# Patient Record
Sex: Male | Born: 2000 | Race: Black or African American | Hispanic: No | Marital: Single | Smoking: Never smoker
Health system: Southern US, Community
[De-identification: ages and names within clinical notes are randomized; demographics above are authoritative.]

---
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
## 2018-10-15 IMAGING — CR DG ANKLE COMPLETE 3+V*R*
3 series · 3 of 3 positions shown · non-contrast
Comparison: None.

CLINICAL DATA: Right ankle pain, football injury

EXAM:
RIGHT ANKLE - COMPLETE 3+ VIEW

[t ankle joint ap right]
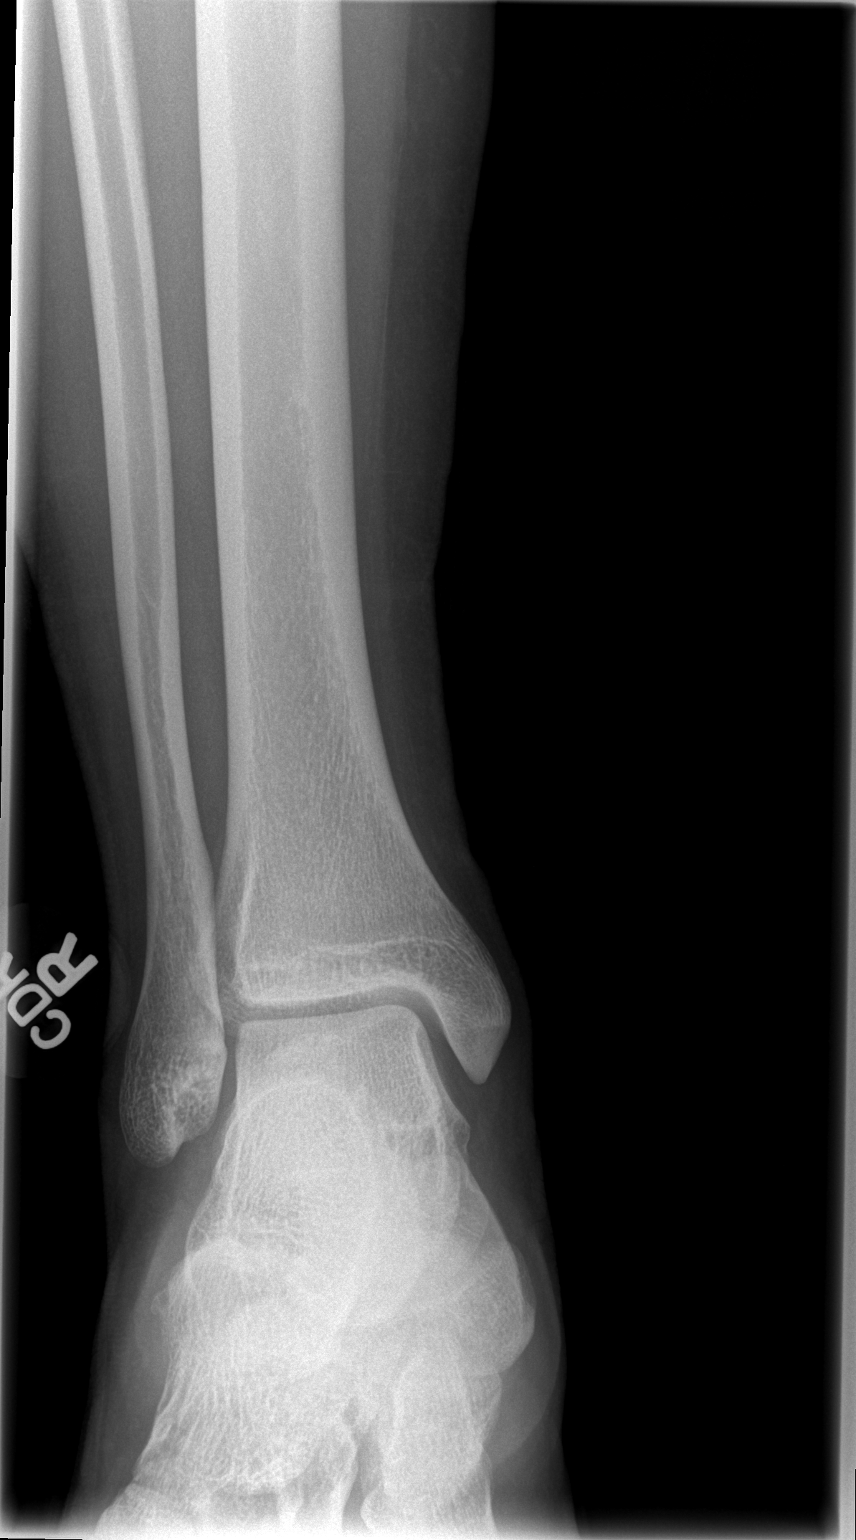

[t ankle joint oblique right]
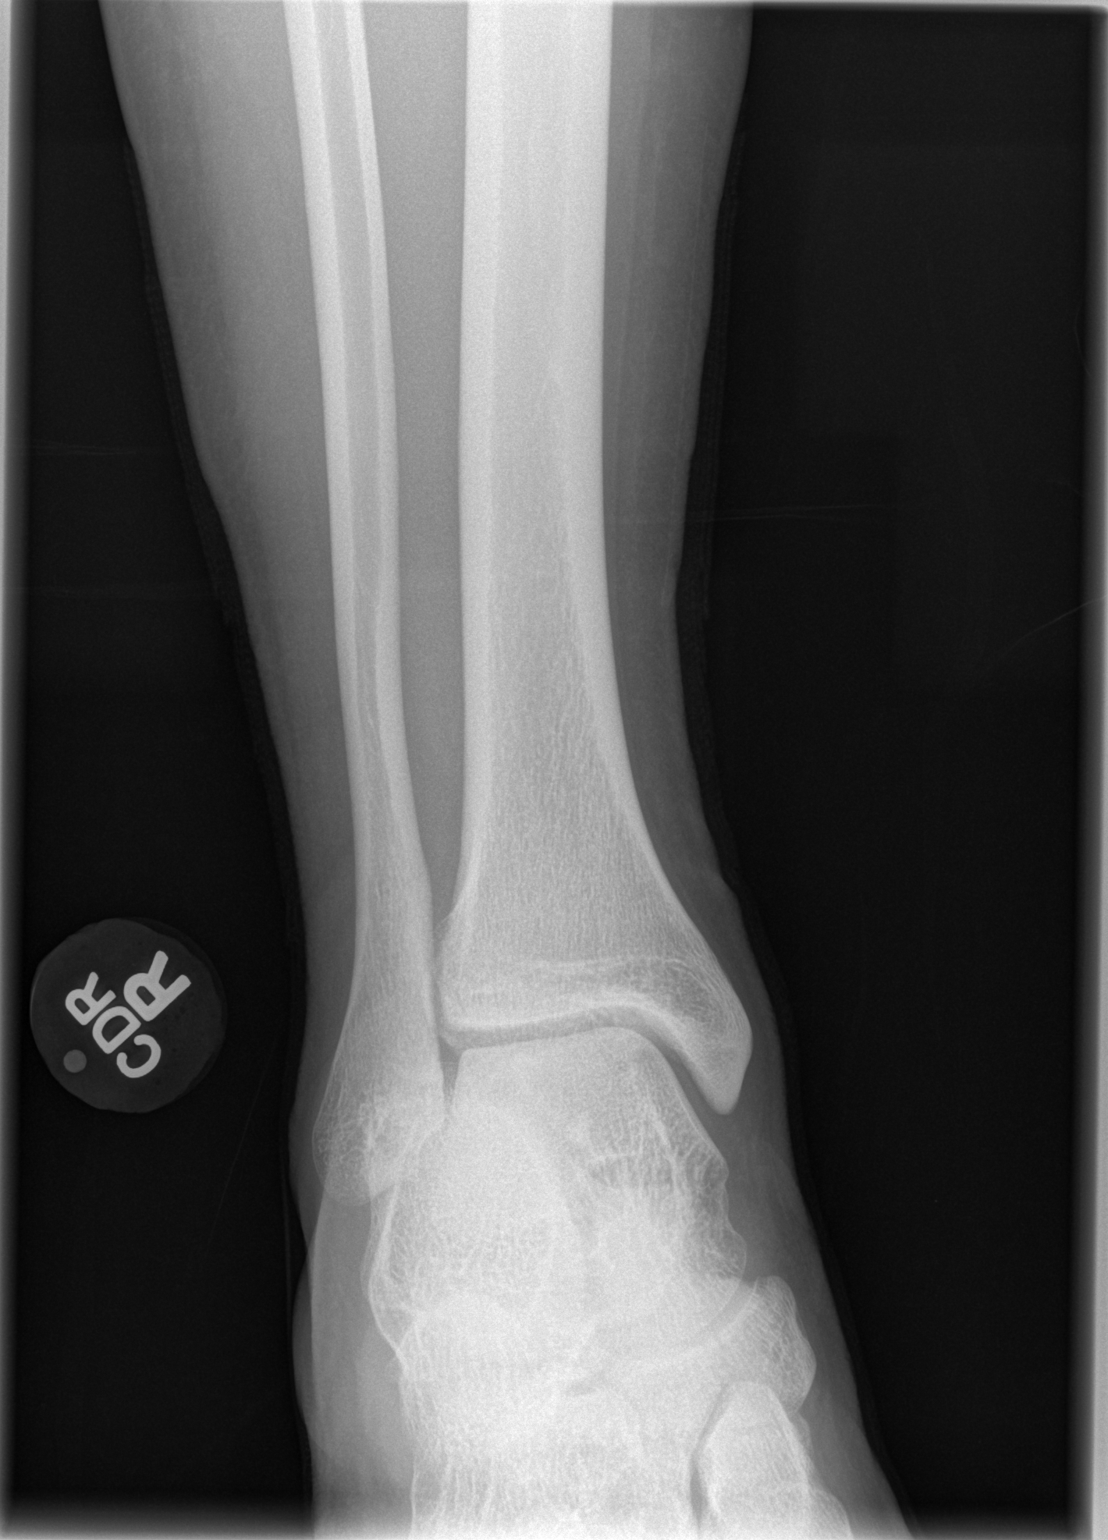

[t ankle joint lat right]
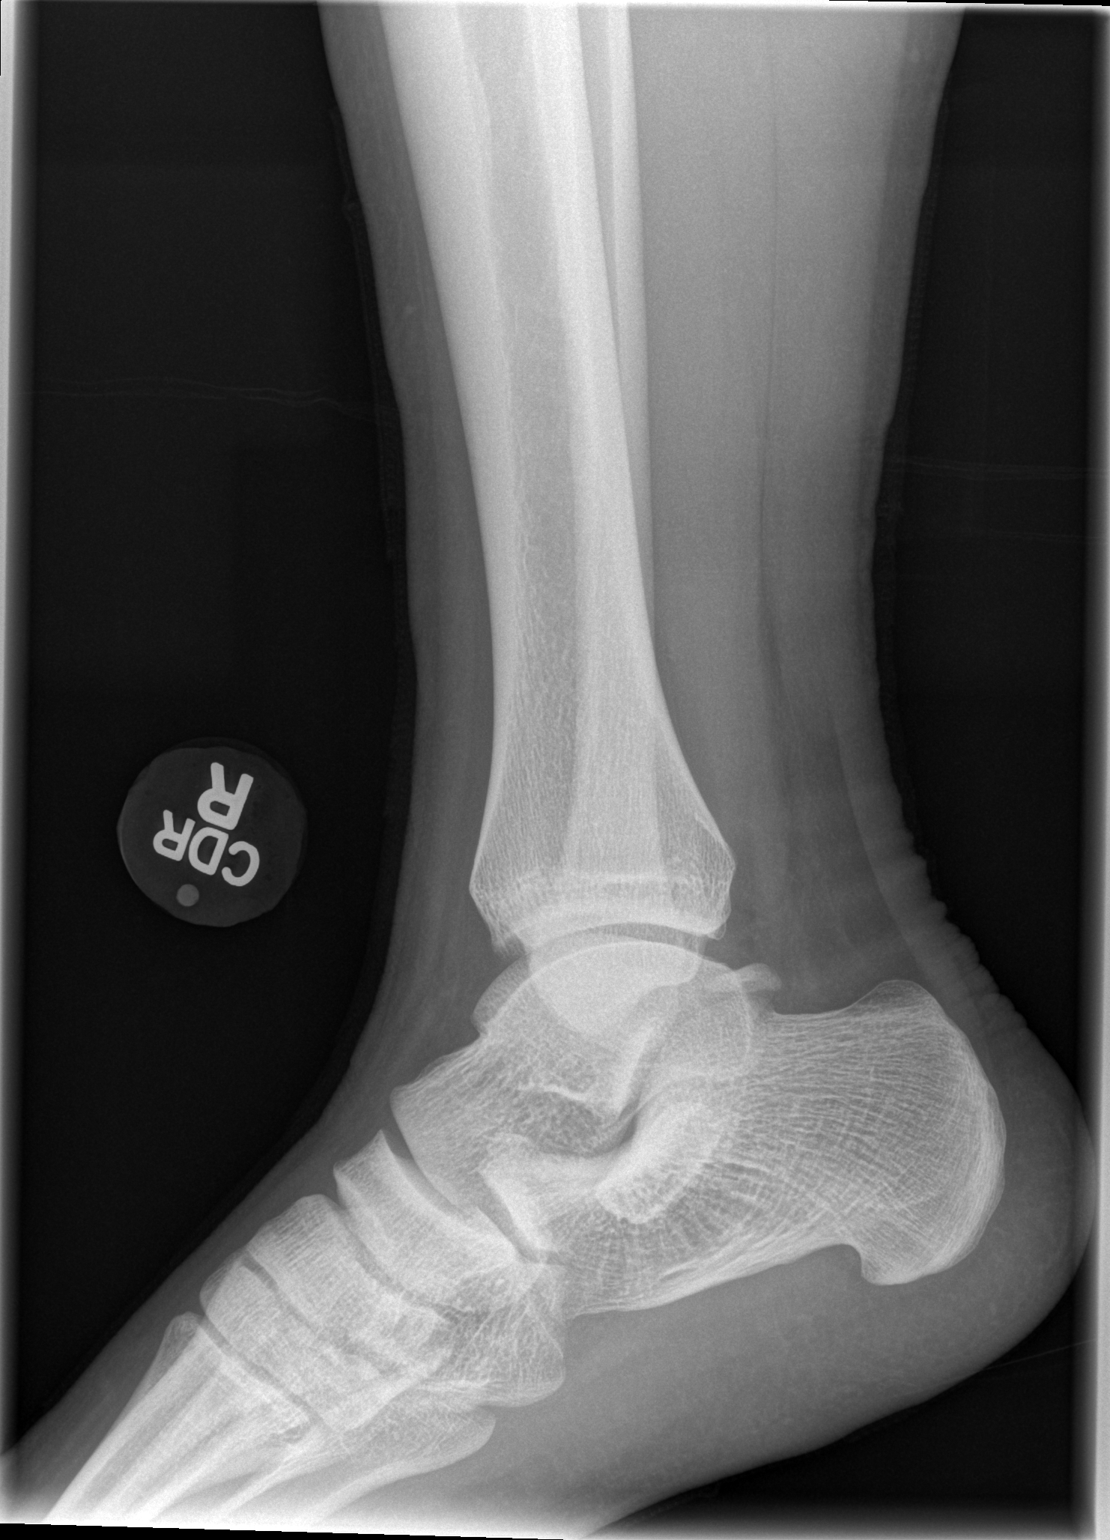

[3 of 3 positions shown; findings below may reference images not displayed]

FINDINGS: No fracture or dislocation is seen.

The ankle mortise is intact.

The base of the fifth metatarsal is unremarkable.

The visualized soft tissues are unremarkable.
IMPRESSION: Negative.

## 2020-12-03 ENCOUNTER — Emergency Department (HOSPITAL_BASED_OUTPATIENT_CLINIC_OR_DEPARTMENT_OTHER)
Admission: EM | Admit: 2020-12-03 | Discharge: 2020-12-04 | Disposition: A | Discharge status: Home / Self Care | Payer: Medicaid Other | Attending: Emergency Medicine | Admitting: Emergency Medicine

## 2020-12-03 ENCOUNTER — Encounter (HOSPITAL_BASED_OUTPATIENT_CLINIC_OR_DEPARTMENT_OTHER): Payer: Self-pay | Admitting: *Deleted

## 2020-12-03 ENCOUNTER — Other Ambulatory Visit: Payer: Self-pay

## 2020-12-03 DIAGNOSIS — F1290 Cannabis use, unspecified, uncomplicated: Secondary | ICD-10-CM | POA: Insufficient documentation

## 2020-12-03 DIAGNOSIS — F19922 Other psychoactive substance use, unspecified with intoxication with perceptual disturbance: Secondary | ICD-10-CM

## 2020-12-03 DIAGNOSIS — F19122 Other psychoactive substance abuse with intoxication with perceptual disturbances: Secondary | ICD-10-CM | POA: Diagnosis not present

## 2020-12-03 DIAGNOSIS — Z7722 Contact with and (suspected) exposure to environmental tobacco smoke (acute) (chronic): Secondary | ICD-10-CM | POA: Diagnosis not present

## 2020-12-03 NOTE — ED Triage Notes (Signed)
Pt reports taking 4-5 THC gummies  at 845pm, mother reports lethargic and weak

## 2020-12-04 LAB — COMPREHENSIVE METABOLIC PANEL WITH GFR
ALT: 33 U/L (ref 0–44)
AST: 29 U/L (ref 15–41)
Albumin: 4.3 g/dL (ref 3.5–5.0)
Alkaline Phosphatase: 63 U/L (ref 38–126)
Anion gap: 10 (ref 5–15)
BUN: 21 mg/dL — ABNORMAL HIGH (ref 6–20)
CO2: 26 mmol/L (ref 22–32)
Calcium: 9.2 mg/dL (ref 8.9–10.3)
Chloride: 103 mmol/L (ref 98–111)
Creatinine, Ser: 1.36 mg/dL — ABNORMAL HIGH (ref 0.61–1.24)
GFR, Estimated: >60 mL/min
Glucose, Bld: 120 mg/dL — ABNORMAL HIGH (ref 70–99)
Potassium: 4.1 mmol/L (ref 3.5–5.1)
Sodium: 139 mmol/L (ref 135–145)
Total Bilirubin: 0.2 mg/dL — ABNORMAL LOW (ref 0.3–1.2)
Total Protein: 7.8 g/dL (ref 6.5–8.1)

## 2020-12-04 LAB — CBC WITH DIFFERENTIAL/PLATELET
Abs Immature Granulocytes: 0.08 K/uL — ABNORMAL HIGH (ref 0.00–0.07)
Basophils Absolute: 0.1 K/uL (ref 0.0–0.1)
Basophils Relative: 0 %
Eosinophils Absolute: 0.1 K/uL (ref 0.0–0.5)
Eosinophils Relative: 1 %
HCT: 42.6 % (ref 39.0–52.0)
Hemoglobin: 13.9 g/dL (ref 13.0–17.0)
Immature Granulocytes: 1 %
Lymphocytes Relative: 27 %
Lymphs Abs: 3.1 K/uL (ref 0.7–4.0)
MCH: 26.3 pg (ref 26.0–34.0)
MCHC: 32.6 g/dL (ref 30.0–36.0)
MCV: 80.7 fL (ref 80.0–100.0)
Monocytes Absolute: 1.2 K/uL — ABNORMAL HIGH (ref 0.1–1.0)
Monocytes Relative: 10 %
Neutro Abs: 6.8 K/uL (ref 1.7–7.7)
Neutrophils Relative %: 61 %
Platelets: 370 K/uL (ref 150–400)
RBC: 5.28 MIL/uL (ref 4.22–5.81)
RDW: 13.2 % (ref 11.5–15.5)
WBC: 11.2 K/uL — ABNORMAL HIGH (ref 4.0–10.5)
nRBC: 0 % (ref 0.0–0.2)

## 2020-12-04 LAB — RAPID URINE DRUG SCREEN, HOSP PERFORMED
Amphetamines: NOT DETECTED
Barbiturates: NOT DETECTED
Benzodiazepines: NOT DETECTED
Cocaine: NOT DETECTED
Opiates: NOT DETECTED
Tetrahydrocannabinol: POSITIVE — AB

## 2020-12-04 LAB — ETHANOL: Alcohol, Ethyl (B): <10 mg/dL (ref <10)

## 2020-12-04 MED ORDER — SODIUM CHLORIDE 0.9 % IV BOLUS
1000.0000 mL | Freq: Once | INTRAVENOUS | Status: AC
  Administered 2020-12-04: 1000 mL via INTRAVENOUS

## 2020-12-04 NOTE — ED Provider Notes (Signed)
[facility] EMERGENCY DEPARTMENT Provider Note  CSN: [id number] Arrival date & time: 12/03/20 2305    History Chief Complaint  Patient presents with  . Ingestion    HPI  Steven Massey is a 20 y.o. male with no significant PMH brought to the ED by his mother who provides the history. Patient was in his usual state of health until just prior to arrival. Mother reports they were sitting on the front porch when he got up and collapsed onto her, unable to stand up and was not speaking clearly. Mother reports she put him in the car with his brother and they headed towards the ED. On the way here, she discovered that he had eaten several THC Edibles that he described as 'like the candy nerds". She states he was able to stand and walk into the ED. She reports he does occasionally drink EtOH but does not think he had any tonight. No known head injuries. He denied any other co-ingestion to her but is unable to give any further details at the time of my evaluation.    History reviewed. No pertinent past medical history.  History reviewed. No pertinent surgical history.  No family history on file.  Social History   Tobacco Use  . Smoking status: Passive Smoke Exposure - Never Smoker  . Smokeless tobacco: Never Used  Substance Use Topics  . Alcohol use: No  . Drug use: Yes    Types: Marijuana     Home Medications Prior to Admission medications   Medication Sig Start Date End Date Taking? Authorizing Provider  acetaminophen-codeine (TYLENOL #3) 300-30 MG per tablet Take 1-2 tablets by mouth every 6 (six) hours as needed for pain. 03/06/13   Muthersbaugh, Dahlia Client, PA-C  ibuprofen (ADVIL,MOTRIN) 800 MG tablet Take 1 tablet (800 mg total) by mouth every 8 (eight) hours as needed for pain. 03/06/13   Muthersbaugh, Dahlia Client, PA-C     Allergies    Patient has no known allergies.   Review of Systems   Review of Systems Unable to assess due to mental status.    Physical Exam BP  117/65 (BP Location: Right Arm)   Pulse 85   Temp 98.3 F (36.8 C) (Oral)   Resp 19   Ht 5\' 5"  (1.651 m)   Wt 109.8 kg   SpO2 100%   BMI 40.27 kg/m   Physical Exam Vitals and nursing note reviewed.  Constitutional:      Appearance: Normal appearance.     Comments: Somnolent  HENT:     Head: Normocephalic and atraumatic.     Nose: Nose normal.     Mouth/Throat:     Mouth: Mucous membranes are moist.  Eyes:     Extraocular Movements: Extraocular movements intact.     Conjunctiva/sclera: Conjunctivae normal.     Pupils: Pupils are equal, round, and reactive to light.  Cardiovascular:     Rate and Rhythm: Normal rate.  Pulmonary:     Effort: Pulmonary effort is normal.     Breath sounds: Normal breath sounds.  Abdominal:     General: Abdomen is flat.     Palpations: Abdomen is soft.     Tenderness: There is no abdominal tenderness.  Musculoskeletal:        General: No swelling. Normal range of motion.     Cervical back: Neck supple.  Skin:    General: Skin is warm and dry.  Neurological:     Comments: Somnolent, arouses to verbal and noxious  stimuli but quickly falls back asleep, exam is limited by patient's mental status.  Psychiatric:        Mood and Affect: Mood normal.      ED Results / Procedures / Treatments   Labs (all labs ordered are listed, but only abnormal results are displayed) Labs Reviewed  RAPID URINE DRUG SCREEN, HOSP PERFORMED - Abnormal; Notable for the following components:      Result Value   Tetrahydrocannabinol POSITIVE (*)    All other components within normal limits  COMPREHENSIVE METABOLIC PANEL - Abnormal; Notable for the following components:   Glucose, Bld 120 (*)    BUN 21 (*)    Creatinine, Ser 1.36 (*)    Total Bilirubin 0.2 (*)    All other components within normal limits  CBC WITH DIFFERENTIAL/PLATELET - Abnormal; Notable for the following components:   WBC 11.2 (*)    Monocytes Absolute 1.2 (*)    Abs Immature Granulocytes  0.08 (*)    All other components within normal limits  ETHANOL    EKG None   Radiology No results found.  Procedures Procedures  Medications Ordered in the ED Medications  sodium chloride 0.9 % bolus 1,000 mL (0 mLs Intravenous Stopped 12/04/20 0249)     MDM Rules/Calculators/A&P MDM Patient is alerted at the time of my evaluation, likely from drug ingestion but unclear if there is more than one substance. Will check labs, give IVF and reassess.   ED Course  I have reviewed the triage vital signs and the nursing notes.  Pertinent labs & imaging results that were available during my care of the patient were reviewed by me and considered in my medical decision making (see chart for details).  Clinical Course as of 12/04/20 0411  Thu Dec 04, 2020  0031 CBC with mild leukocytosis of unclear significance. Otherwise normal.  [CS]  0052 CMP and EtOH is neg.  [CS]  0137 Patient still sleeping soundly, minimal response to verbal stimuli.  [CS]  0409 Patient more alert now, has been able to walk to the bathroom to give a urine specimen which confirms THC but no other signs of co-ingestion. Mother is comfortable taking him home to continue sobering. Advised to avoid edibles in the future. RTED for any other concerns.  [CS]    Clinical Course User Index [CS] Pollyann Savoy, MD    Final Clinical Impression(s) / ED Diagnoses Final diagnoses:  Marijuana use  Drug intoxication with perceptual disturbance [facility]    Rx / DC Orders ED Discharge Orders    None       Pollyann Savoy, MD 12/04/20 [phone]

## 2022-06-17 ENCOUNTER — Emergency Department (HOSPITAL_BASED_OUTPATIENT_CLINIC_OR_DEPARTMENT_OTHER)
Admission: EM | Admit: 2022-06-17 | Discharge: 2022-06-17 | Disposition: A | Discharge status: Home / Self Care | Payer: Medicaid Other | Attending: Emergency Medicine | Admitting: Emergency Medicine

## 2022-06-17 ENCOUNTER — Encounter (HOSPITAL_BASED_OUTPATIENT_CLINIC_OR_DEPARTMENT_OTHER): Payer: Self-pay

## 2022-06-17 DIAGNOSIS — R197 Diarrhea, unspecified: Secondary | ICD-10-CM | POA: Insufficient documentation

## 2022-06-17 DIAGNOSIS — R1110 Vomiting, unspecified: Secondary | ICD-10-CM | POA: Diagnosis present

## 2022-06-17 DIAGNOSIS — Z20822 Contact with and (suspected) exposure to covid-19: Secondary | ICD-10-CM | POA: Diagnosis not present

## 2022-06-17 DIAGNOSIS — R112 Nausea with vomiting, unspecified: Secondary | ICD-10-CM | POA: Diagnosis not present

## 2022-06-17 LAB — SARS CORONAVIRUS 2 BY RT PCR: SARS Coronavirus 2 by RT PCR: NEGATIVE

## 2022-06-17 MED ORDER — LOPERAMIDE HCL 2 MG PO CAPS: 2.0000 mg | ORAL_CAPSULE | Freq: Four times a day (QID) | ORAL | 0 refills | Status: AC | PRN

## 2022-06-17 MED ORDER — ONDANSETRON HCL 4 MG PO TABS: 4.0000 mg | ORAL_TABLET | Freq: Three times a day (TID) | ORAL | 0 refills | Status: AC | PRN

## 2022-06-17 NOTE — ED Provider Notes (Signed)
[facility] EMERGENCY DEPARTMENT Provider Note   CSN: [id number] Arrival date & time: 06/17/22  0807     History  Chief Complaint  Patient presents with   Vomiting    Steven Massey is a 21 y.o. male.  Steven Massey has no significant past medical history.  Steven Massey started with some rumbling in Steven Massey stomach last evening followed by 2 episodes of vomiting and 2 episodes of diarrhea.  No blood.  No abdominal pain no fevers.  Daughter was sick with similar symptoms few days prior.  No recent travel.  Steven Massey did not want to go to work today and get Steven Massey coworker sick so came here for evaluation.  Has tried nothing for Steven Massey symptoms.  The history is provided by the patient.  GI Problem This is a new problem. The current episode started 6 to 12 hours ago. The problem has not changed since onset.Pertinent negatives include no chest pain, no abdominal pain, no headaches and no shortness of breath. Nothing aggravates the symptoms. Nothing relieves the symptoms. Steven Massey has tried rest for the symptoms. The treatment provided no relief.       Home Medications Prior to Admission medications   Medication Sig Start Date End Date Taking? Authorizing Provider  acetaminophen-codeine (TYLENOL #3) 300-30 MG per tablet Take 1-2 tablets by mouth every 6 (six) hours as needed for pain. 03/06/13   Muthersbaugh, Dahlia Client, PA-C  ibuprofen (ADVIL,MOTRIN) 800 MG tablet Take 1 tablet (800 mg total) by mouth every 8 (eight) hours as needed for pain. 03/06/13   Muthersbaugh, Dahlia Client, PA-C      Allergies    Patient has no known allergies.    Review of Systems   Review of Systems  Constitutional:  Negative for fever.  Respiratory:  Negative for shortness of breath.   Cardiovascular:  Negative for chest pain.  Gastrointestinal:  Positive for diarrhea, nausea and vomiting. Negative for abdominal pain.  Musculoskeletal:  Negative for arthralgias.  Neurological:  Negative for headaches.    Physical Exam Updated Vital Signs BP  113/79 (BP Location: Right Arm)   Pulse 91   Temp 98.5 F (36.9 C) (Oral)   Resp 18   Ht 5\' 8"  (1.727 m)   Wt 104.3 kg   SpO2 99%   BMI 34.97 kg/m  Physical Exam Vitals and nursing note reviewed.  Constitutional:      General: Steven Massey is not in acute distress.    Appearance: Normal appearance. Steven Massey is well-developed.  HENT:     Head: Normocephalic and atraumatic.  Eyes:     Conjunctiva/sclera: Conjunctivae normal.  Cardiovascular:     Rate and Rhythm: Normal rate and regular rhythm.     Heart sounds: No murmur heard. Pulmonary:     Effort: Pulmonary effort is normal. No respiratory distress.     Breath sounds: Normal breath sounds.  Abdominal:     Palpations: Abdomen is soft.     Tenderness: There is no abdominal tenderness. There is no guarding or rebound.  Musculoskeletal:        General: No swelling.     Cervical back: Neck supple.  Skin:    General: Skin is warm and dry.     Capillary Refill: Capillary refill takes less than 2 seconds.  Neurological:     General: No focal deficit present.     Mental Status: Steven Massey is alert.     ED Results / Procedures / Treatments   Labs (all labs ordered are listed, but only abnormal results are  displayed) Labs Reviewed  SARS CORONAVIRUS 2 BY RT PCR    EKG None  Radiology No results found.  Procedures Procedures    Medications Ordered in ED Medications - No data to display  ED Course/ Medical Decision Making/ A&P                           Medical Decision Making Risk Prescription drug management.   Steven Massey was evaluated in Emergency Department on 06/17/2022 for the symptoms described in the history of present illness. Steven Massey was evaluated in the context of the global COVID-19 pandemic, which necessitated consideration that the patient might be at risk for infection with the SARS-CoV-2 virus that causes COVID-19. Institutional protocols and algorithms that pertain to the evaluation of patients at risk for COVID-19 are  in a state of rapid change based on information released by regulatory bodies including the CDC and federal and state organizations. These policies and algorithms were followed during the patient's care in the ED.  Differential diagnosis includes viral syndrome, gastroenteritis, COVID, flu, food poisoning.  Patient has a benign exam and stable vitals.  Doubt dehydration this early into Steven Massey illness.  Will treat symptomatically and return instructions discussed.  No indications for admission or further work-up at this time.       Final Clinical Impression(s) / ED Diagnoses Final diagnoses:  Nausea vomiting and diarrhea    Rx / DC Orders ED Discharge Orders          Ordered    ondansetron (ZOFRAN) 4 MG tablet  Every 8 hours PRN        06/17/22 0835    loperamide (IMODIUM) 2 MG capsule  4 times daily PRN        06/17/22 0835              Hayden Rasmussen, MD 06/17/22 1805

## 2022-06-17 NOTE — Discharge Instructions (Signed)
You were seen in the emergency department for nausea vomiting diarrhea.  This is likely due to a virus.  We are prescribing you some nausea and diarrhea medication.  Please start with a clear liquid diet advance as tolerated.  Return to the emergency department if any high fevers, blood in your stool or vomit, abdominal pain.  COVID test was done and you can follow-up this result on MyChart.

## 2022-06-17 NOTE — ED Triage Notes (Addendum)
States vomiting/ diarrhea since yesterday, denies fever. States able to eat/drink. Daughter with similar symptoms earlier in the week. NAD during triage. Denies abdominal pain, requesting note for work.

## 2022-06-24 ENCOUNTER — Other Ambulatory Visit: Payer: Self-pay

## 2022-06-24 ENCOUNTER — Emergency Department (HOSPITAL_BASED_OUTPATIENT_CLINIC_OR_DEPARTMENT_OTHER)
Admission: EM | Admit: 2022-06-24 | Discharge: 2022-06-24 | Disposition: A | Discharge status: Home / Self Care | Payer: Medicaid Other | Attending: Emergency Medicine | Admitting: Emergency Medicine

## 2022-06-24 ENCOUNTER — Encounter (HOSPITAL_BASED_OUTPATIENT_CLINIC_OR_DEPARTMENT_OTHER): Payer: Self-pay

## 2022-06-24 DIAGNOSIS — R944 Abnormal results of kidney function studies: Secondary | ICD-10-CM | POA: Insufficient documentation

## 2022-06-24 DIAGNOSIS — R8289 Other abnormal findings on cytological and histological examination of urine: Secondary | ICD-10-CM | POA: Diagnosis not present

## 2022-06-24 DIAGNOSIS — R7401 Elevation of levels of liver transaminase levels: Secondary | ICD-10-CM | POA: Insufficient documentation

## 2022-06-24 DIAGNOSIS — R197 Diarrhea, unspecified: Secondary | ICD-10-CM | POA: Diagnosis present

## 2022-06-24 LAB — CBC
HCT: 46.2 % (ref 39.0–52.0)
Hemoglobin: 14.8 g/dL (ref 13.0–17.0)
MCH: 25.9 pg — ABNORMAL LOW (ref 26.0–34.0)
MCHC: 32 g/dL (ref 30.0–36.0)
MCV: 80.8 fL (ref 80.0–100.0)
Platelets: 393 10*3/uL (ref 150–400)
RBC: 5.72 MIL/uL (ref 4.22–5.81)
RDW: 13.1 % (ref 11.5–15.5)
WBC: 7.9 10*3/uL (ref 4.0–10.5)
nRBC: 0 % (ref 0.0–0.2)

## 2022-06-24 LAB — COMPREHENSIVE METABOLIC PANEL
ALT: 52 U/L — ABNORMAL HIGH (ref 0–44)
AST: 39 U/L (ref 15–41)
Albumin: 5 g/dL (ref 3.5–5.0)
Alkaline Phosphatase: 88 U/L (ref 38–126)
Anion gap: 11 (ref 5–15)
BUN: 14 mg/dL (ref 6–20)
CO2: 26 mmol/L (ref 22–32)
Calcium: 10 mg/dL (ref 8.9–10.3)
Chloride: 103 mmol/L (ref 98–111)
Creatinine, Ser: 1.33 mg/dL — ABNORMAL HIGH (ref 0.61–1.24)
GFR, Estimated: >60 mL/min (ref >60)
Glucose, Bld: 79 mg/dL (ref 70–99)
Potassium: 3.5 mmol/L (ref 3.5–5.1)
Sodium: 140 mmol/L (ref 135–145)
Total Bilirubin: 0.3 mg/dL (ref 0.3–1.2)
Total Protein: 8 g/dL (ref 6.5–8.1)

## 2022-06-24 LAB — URINALYSIS, ROUTINE W REFLEX MICROSCOPIC
Bilirubin Urine: NEGATIVE
Glucose, UA: NEGATIVE mg/dL
Hgb urine dipstick: NEGATIVE
Ketones, ur: NEGATIVE mg/dL
Leukocytes,Ua: NEGATIVE
Nitrite: NEGATIVE
Protein, ur: 30 mg/dL — AB
Specific Gravity, Urine: >=1.03 (ref 1.005–1.030)
pH: 5.5 (ref 5.0–8.0)

## 2022-06-24 LAB — URINALYSIS, MICROSCOPIC (REFLEX)

## 2022-06-24 LAB — LIPASE, BLOOD: Lipase: 45 U/L (ref 11–51)

## 2022-06-24 MED ORDER — LOPERAMIDE HCL 2 MG PO CAPS
2.0000 mg | ORAL_CAPSULE | Freq: Once | ORAL | Status: AC
  Administered 2022-06-24: 2 mg via ORAL
  Filled 2022-06-24: qty 1

## 2022-06-24 NOTE — ED Provider Notes (Signed)
[facility] EMERGENCY DEPARTMENT Provider Note   CSN: [id number] Arrival date & time: 06/24/22  1158     History  Chief Complaint  Patient presents with   Abdominal Pain   Diarrhea    Steven Massey is a 21 y.o. male.  Patient presents to the hospital complaining of 1 week of diarrhea.  He denies abdominal pain, nausea, vomiting at this time.  He was initially evaluated in this emergency department on October 19 for the same complaint.  At that time he had vomited twice and had a few episodes of diarrhea.  Since going home he has tried Pepto-Bismol but has tried no other over-the-counter medications.  He states he has been able to tolerate oral intake with no problems.  He states he has been staying hydrated.  He denies shortness of breath, blood in stool, chest pain.  Patient states that work sent him home today due to going to the bathroom multiple times.  He states he needs a note for work.  No relevant past medical history on file.  HPI     Home Medications Prior to Admission medications   Medication Sig Start Date End Date Taking? Authorizing Provider  acetaminophen-codeine (TYLENOL #3) 300-30 MG per tablet Take 1-2 tablets by mouth every 6 (six) hours as needed for pain. 03/06/13   Muthersbaugh, Dahlia Client, PA-C  ibuprofen (ADVIL,MOTRIN) 800 MG tablet Take 1 tablet (800 mg total) by mouth every 8 (eight) hours as needed for pain. 03/06/13   Muthersbaugh, Dahlia Client, PA-C  loperamide (IMODIUM) 2 MG capsule Take 1 capsule (2 mg total) by mouth 4 (four) times daily as needed for diarrhea or loose stools. 06/17/22   Terrilee Files, MD  ondansetron (ZOFRAN) 4 MG tablet Take 1 tablet (4 mg total) by mouth every 8 (eight) hours as needed for nausea or vomiting. 06/17/22   Terrilee Files, MD      Allergies    Patient has no known allergies.    Review of Systems   Review of Systems  Respiratory:  Negative for shortness of breath.   Cardiovascular:  Negative for chest pain.   Gastrointestinal:  Positive for diarrhea. Negative for abdominal pain, anal bleeding, blood in stool, nausea and vomiting.    Physical Exam Updated Vital Signs BP 127/75 (BP Location: Right Arm)   Pulse 79   Temp 98.6 F (37 C)   Resp 17   Ht 5\' 8"  (1.727 m)   Wt 104.3 kg   SpO2 99%   BMI 34.97 kg/m  Physical Exam Vitals and nursing note reviewed.  Constitutional:      General: He is not in acute distress.    Appearance: He is well-developed.  HENT:     Head: Normocephalic and atraumatic.  Eyes:     Conjunctiva/sclera: Conjunctivae normal.  Cardiovascular:     Rate and Rhythm: Normal rate and regular rhythm.     Heart sounds: No murmur heard. Pulmonary:     Effort: Pulmonary effort is normal. No respiratory distress.     Breath sounds: Normal breath sounds.  Abdominal:     Palpations: Abdomen is soft.     Tenderness: There is no abdominal tenderness.  Musculoskeletal:        General: No swelling.     Cervical back: Neck supple.  Skin:    General: Skin is warm and dry.     Capillary Refill: Capillary refill takes less than 2 seconds.  Neurological:     Mental Status: He is  alert.  Psychiatric:        Mood and Affect: Mood normal.     ED Results / Procedures / Treatments   Labs (all labs ordered are listed, but only abnormal results are displayed) Labs Reviewed  COMPREHENSIVE METABOLIC PANEL - Abnormal; Notable for the following components:      Result Value   Creatinine, Ser 1.33 (*)    ALT 52 (*)    All other components within normal limits  CBC - Abnormal; Notable for the following components:   MCH 25.9 (*)    All other components within normal limits  URINALYSIS, ROUTINE W REFLEX MICROSCOPIC - Abnormal; Notable for the following components:   Protein, ur 30 (*)    All other components within normal limits  URINALYSIS, MICROSCOPIC (REFLEX) - Abnormal; Notable for the following components:   Bacteria, UA RARE (*)    All other components within normal  limits  GASTROINTESTINAL PANEL BY PCR, STOOL (REPLACES STOOL CULTURE)  LIPASE, BLOOD    EKG None  Radiology No results found.  Procedures Procedures    Medications Ordered in ED Medications  loperamide (IMODIUM) capsule 2 mg (2 mg Oral Given 06/24/22 1557)    ED Course/ Medical Decision Making/ A&P                           Medical Decision Making Amount and/or Complexity of Data Reviewed Labs: ordered.  Risk Prescription drug management.   This patient presents to the ED for concern of diarrhea, this involves an extensive number of treatment options, and is a complaint that carries with it a high risk of complications and morbidity.  The differential diagnosis includes gastroenteritis, colitis, IBS, and other illnesses   Co morbidities that complicate the patient evaluation  None   Additional history obtained:  Additional history obtained from N/A External records from outside source obtained and reviewed including ED records from October 19   Lab Tests:  I Ordered, and personally interpreted labs.  The pertinent results include: UA with rare bacteria, 30 protein; creatinine 1.33 (at patient's baseline), ALT mildly elevated at 52, unremarkable CBC, stool panel pending   Imaging Studies ordered:  I considered ordering abdominal imaging but the patient has no abdominal tenderness on exam.  I see no utility of imaging at this time    Problem List / ED Course / Critical interventions / Medication management   I ordered medication including Imodium for diarrhea Reevaluation of the patient after these medicines showed that the patient stayed the same I have reviewed the patients home medicines and have made adjustments as needed   Social Determinants of Health:  Patient currently has no primary care provider   Test / Admission - Considered:  The patient has no abdominal tenderness to suggest gallbladder etiology, appendicitis.  He has no blood in his  stool he is adjusted diverticulitis.  He has no other symptoms outside of the diarrhea.  I feel that COVID is unlikely.  This is most consistent with a bacterial or viral GI illness.  The patient did provide a stool sample for PCR testing.  I recommend at this time that he try Imodium at home as he has not tried this yet.  I will provide a note for work to have him return on Saturday after he has tried Imodium.  If the stool panel is negative he may need GI follow-up in the future.  He does plan to establish care with  a primary care provider.        Final Clinical Impression(s) / ED Diagnoses Final diagnoses:  Diarrhea, unspecified type    Rx / DC Orders ED Discharge Orders     None         Ronny Bacon 06/24/22 1607    Tegeler, Gwenyth Allegra, MD 06/24/22 2228

## 2022-06-24 NOTE — Discharge Instructions (Signed)
You were seen today for diarrhea.  This may be viral in nature.  The testing on your stool sample will not be completed today.  You may follow MyChart for results as they are returned.  Please try Imodium A-D, an over-the-counter medication, for your diarrhea.  Please follow the instructions on the label.

## 2022-06-24 NOTE — ED Triage Notes (Signed)
Reports has been here twice and told his abd pain and diarrhea was a stomach virus.  Comes back with same complaint.  Reports no blood in stool or fever.  Denies n/v

## 2022-06-25 ENCOUNTER — Telehealth (HOSPITAL_BASED_OUTPATIENT_CLINIC_OR_DEPARTMENT_OTHER): Payer: Self-pay | Admitting: Emergency Medicine

## 2022-06-25 LAB — GASTROINTESTINAL PANEL BY PCR, STOOL (REPLACES STOOL CULTURE)

## 2023-08-16 ENCOUNTER — Encounter (HOSPITAL_BASED_OUTPATIENT_CLINIC_OR_DEPARTMENT_OTHER): Payer: Self-pay

## 2023-08-16 ENCOUNTER — Emergency Department (HOSPITAL_BASED_OUTPATIENT_CLINIC_OR_DEPARTMENT_OTHER): Payer: MEDICAID

## 2023-08-16 ENCOUNTER — Emergency Department (HOSPITAL_BASED_OUTPATIENT_CLINIC_OR_DEPARTMENT_OTHER)
Admission: EM | Admit: 2023-08-16 | Discharge: 2023-08-16 | Disposition: A | Discharge status: Home / Self Care | Payer: MEDICAID | Attending: Emergency Medicine | Admitting: Emergency Medicine

## 2023-08-16 DIAGNOSIS — M79605 Pain in left leg: Secondary | ICD-10-CM

## 2023-08-16 DIAGNOSIS — M79652 Pain in left thigh: Secondary | ICD-10-CM | POA: Diagnosis present

## 2023-08-16 MED ORDER — ACETAMINOPHEN 500 MG PO TABS
1000.0000 mg | ORAL_TABLET | Freq: Once | ORAL | Status: AC
  Administered 2023-08-16: 1000 mg via ORAL
  Filled 2023-08-16: qty 2

## 2023-08-16 NOTE — ED Triage Notes (Signed)
States was running with his child last night and his leg gave out and he fell. C/o left thigh pain. Pain worse with movement

## 2023-08-16 NOTE — Discharge Instructions (Signed)
You have been seen today for your complaint of left leg pain. Your imaging is pending at discharge.  If there are any abnormalities, I will call you.  If I do not call you, the x-ray has been read as normal. Your discharge medications include Alternate tylenol and ibuprofen for pain. You may alternate these every 4 hours. You may take up to 800 mg of ibuprofen at a time and up to 1000 mg of tylenol. Home care instructions are as follows:  Continue to wear the Ace wrap.  Ice the leg for 15 minutes at a time, multiple times a day Follow up with: Your primary care provider within the next week for reevaluation Please seek immediate medical care if you develop any of the following symptoms: Your pain, bruising, or tenderness gets worse, even with treatment. Your leg becomes weaker. At this time there does not appear to be the presence of an emergent medical condition, however there is always the potential for conditions to change. Please read and follow the below instructions.  Do not take your medicine if  develop an itchy rash, swelling in your mouth or lips, or difficulty breathing; call 911 and seek immediate emergency medical attention if this occurs.  You may review your lab tests and imaging results in their entirety on your MyChart account.  Please discuss all results of fully with your primary care provider and other specialist at your follow-up visit.  Note: Portions of this text may have been transcribed using voice recognition software. Every effort was made to ensure accuracy; however, inadvertent computerized transcription errors may still be present.

## 2023-08-16 NOTE — ED Provider Notes (Signed)
[place] [facility] DEPART[facility] AT [facility] Provider Note   CSN: [id number] Arrival date & time: 08/16/23  0848     History  Chief Complaint  Patient presents with   Leg Pain    Steven Massey is a 22 y.o. male.  Came to the ED for evaluation of left upper leg pain.  States he was chasing his daughter around the house yesterday at approximately 7 PM when his left leg gave out and he fell.  He has had pain to the anterior left upper leg since that time.  Reports a history of similar a few years ago on the right leg.  Was told he had a quadricep muscle.  He denies any knee pain.  No hip pain.  No numbness, weakness or tingling.   Leg Pain      Home Medications Prior to Admission medications   Medication Sig Start Date End Date Taking? Authorizing Provider  acetaminophen-codeine (TYLENOL #3) 300-30 MG per tablet Take 1-2 tablets by mouth every 6 (six) hours as needed for pain. 03/06/13   Muthersbaugh, Dahlia Client, PA-C  ibuprofen (ADVIL,MOTRIN) 800 MG tablet Take 1 tablet (800 mg total) by mouth every 8 (eight) hours as needed for pain. 03/06/13   Muthersbaugh, Dahlia Client, PA-C  loperamide (IMODIUM) 2 MG capsule Take 1 capsule (2 mg total) by mouth 4 (four) times daily as needed for diarrhea or loose stools. 06/17/22   Terrilee Files, MD  ondansetron (ZOFRAN) 4 MG tablet Take 1 tablet (4 mg total) by mouth every 8 (eight) hours as needed for nausea or vomiting. 06/17/22   Terrilee Files, MD      Allergies    Patient has no known allergies.    Review of Systems   Review of Systems  Musculoskeletal:  Positive for myalgias.  All other systems reviewed and are negative.   Physical Exam Updated Vital Signs BP (!) 162/103 (BP Location: Left Arm)   Pulse (!) 102   Temp 99 F (37.2 C) (Oral)   Resp 18   Ht 5\' 8"  (1.727 m)   Wt 117.9 kg   SpO2 99%   BMI 39.53 kg/m  Physical Exam Vitals and nursing note reviewed.  Constitutional:      General: He is not in acute  distress.    Appearance: Normal appearance. He is normal weight. He is not ill-appearing.  HENT:     Head: Normocephalic and atraumatic.  Pulmonary:     Effort: Pulmonary effort is normal. No respiratory distress.  Abdominal:     General: Abdomen is flat.  Musculoskeletal:        General: Normal range of motion.     Cervical back: Neck supple.     Comments: Mild TTP to left anterior upper leg.  No bruising or swelling.  No knee TTP.  No posterior left upper leg TTP.  No hip TTP.  PT pulse 2+.  Normal knee flexion and extension  Skin:    General: Skin is warm and dry.  Neurological:     Mental Status: He is alert and oriented to person, place, and time.  Psychiatric:        Mood and Affect: Mood normal.        Behavior: Behavior normal.     ED Results / Procedures / Treatments   Labs (all labs ordered are listed, but only abnormal results are displayed) Labs Reviewed - No data to display  EKG None  Radiology DG Femur Min 2 Views Left  Result Date: 08/16/2023 CLINICAL DATA:  Left mid thigh pain since yesterday. pain after fall yesterday EXAM: LEFT FEMUR 2 VIEWS COMPARISON:  None Available. FINDINGS: No acute fracture or dislocation. No aggressive osseous lesion. Unremarkable left hip and knee joints. No focal soft tissue swelling. No radiopaque foreign bodies. IMPRESSION: *No acute osseous abnormality of the left femur. Electronically Signed   By: Jules Schick M.D.   On: 08/16/2023 11:47    Procedures Procedures    Medications Ordered in ED Medications  acetaminophen (TYLENOL) tablet 1,000 mg (1,000 mg Oral Given 08/16/23 1610)    ED Course/ Medical Decision Making/ A&P                                 Medical Decision Making Amount and/or Complexity of Data Reviewed Radiology: ordered.  Risk OTC drugs.  This patient presents to the ED for concern of left leg pain, this involves an extensive number of treatment options, and is a complaint that carries with it a  high risk of complications and morbidity.  The differential diagnosis includes fracture, sprain, strain, contusion, dislocation  My initial workup includes imaging, Ace wrap, pain control  Additional history obtained from: Nursing notes from this visit.  I ordered imaging studies including x-ray of left femur I independently visualized and interpreted imaging which showed negative I agree with the radiologist interpretation  Afebrile, hemodynamically stable.  22 year old male presenting for evaluation of left upper leg pain.  Symptoms began yesterday after chasing his daughter and falling.  States his leg gave out.  Has a history of quadriceps tear of the right leg.  He appears well on physical exam.  Mild tenderness to palpation of the left upper anterior leg.  No lower leg injury.  No weakness.  Neurovascular status intact.  X-ray negative.  Overall suspect quadriceps tendon strain.  He was given an Ace wrap in the emergency department.  Declines crutches.  He was encouraged to follow-up with his primary care provider in 1 week for reevaluation.  He was given return precautions.  Stable at discharge.  At this time there does not appear to be any evidence of an acute emergency medical condition and the patient appears stable for discharge with appropriate outpatient follow up. Diagnosis was discussed with patient who verbalizes understanding of care plan and is agreeable to discharge. I have discussed return precautions with patient who verbalizes understanding. Patient encouraged to follow-up with their PCP within 1 week. All questions answered.  Note: Portions of this report may have been transcribed using voice recognition software. Every effort was made to ensure accuracy; however, inadvertent computerized transcription errors may still be present.         Final Clinical Impression(s) / ED Diagnoses Final diagnoses:  Left leg pain    Rx / DC Orders ED Discharge Orders     None          Michelle Piper, PA-C 08/16/23 1211    Sloan Leiter, DO 08/17/23 0710
